# Patient Record
Sex: Female | Born: 1990 | Race: White | Hispanic: Yes | Marital: Single | Smoking: Never smoker
Health system: Southern US, Community
[De-identification: ages and names within clinical notes are randomized; demographics above are authoritative.]

---

## 2016-11-04 ENCOUNTER — Ambulatory Visit (INDEPENDENT_AMBULATORY_CARE_PROVIDER_SITE_OTHER): Payer: Self-pay | Admitting: Pharmacist

## 2016-11-04 ENCOUNTER — Encounter: Payer: Self-pay | Admitting: Pharmacist

## 2016-11-04 ENCOUNTER — Ambulatory Visit (INDEPENDENT_AMBULATORY_CARE_PROVIDER_SITE_OTHER): Payer: Self-pay | Admitting: Family Medicine

## 2016-11-04 ENCOUNTER — Encounter: Payer: Self-pay | Admitting: Family Medicine

## 2016-11-04 ENCOUNTER — Ambulatory Visit
Admission: RE | Admit: 2016-11-04 | Discharge: 2016-11-04 | Disposition: A | Discharge status: Home / Self Care | Payer: No Typology Code available for payment source | Source: Ambulatory Visit | Attending: Family Medicine | Admitting: Family Medicine

## 2016-11-04 VITALS — BP 104/68 | HR 81 | Temp 97.7°F | Ht 64.0 in | Wt 159.8 lb

## 2016-11-04 DIAGNOSIS — Y9315 Activity, underwater diving and snorkeling: Secondary | ICD-10-CM

## 2016-11-04 DIAGNOSIS — Z008 Encounter for other general examination: Secondary | ICD-10-CM

## 2016-11-04 DIAGNOSIS — Z0000 Encounter for general adult medical examination without abnormal findings: Secondary | ICD-10-CM

## 2016-11-04 LAB — POCT UA - GLUCOSE/PROTEIN
GLUCOSE UA: NEGATIVE
PROTEIN UA: NEGATIVE

## 2016-11-04 LAB — GLUCOSE, POCT (MANUAL RESULT ENTRY): POC GLUCOSE: 85 mg/dL (ref 70–99)

## 2016-11-04 LAB — POCT HEMOGLOBIN: HEMOGLOBIN: 13.7 g/dL (ref 12.2–16.2)

## 2016-11-04 NOTE — Patient Instructions (Addendum)
Thank you for coming in today!  Your pulmonary function test results revealed adequate lung function for diving.  Please consider a trial of an antihistamine (Claritin, Zyrtec or Allegra) and nasal saline to help with allergy symptoms. These are available over the counter.

## 2016-11-04 NOTE — Assessment & Plan Note (Signed)
Spirometry evaluation without bronchodilator reveals near normal lung function.  FEV1, FVC were slightly below threshold for spirometric parameters, however not enough to be concerning for diving. FEV1/FVC ratio exceeds threshold.

## 2016-11-04 NOTE — Progress Notes (Signed)
Patient ID: Carmen Roberts, female   DOB: 15-Jul-1990, 26 y.o.   MRN: [id number] Reviewed: Agree with Dr. Macky LowerKoval's documentation and management.

## 2016-11-04 NOTE — Progress Notes (Signed)
Subjective:    Carmen Roberts is a 26 y.o. female who [facility]:  [facility].  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  Denies any complications or injuries while diving.  Has never failed a fitness to dive physical.  Currently well, without complaints.  The following portions of the patient's history were reviewed and updated as appropriate: allergies, current medications, past medical history, family and social history, and problem list.  PMH reviewed.  No past medical history on file. No past surgical history on file.  Medications reviewed. No current outpatient prescriptions on file.   No current facility-administered medications for this visit.      PMH:   - denies - No other hospitalizations or other prior medical history   PSH: - denies  Family History: - no lung/pulmonary issues that patient knows of.  Social: - Never smoker - Denies illicit drug use - Very occasional social drinker (1-2 drinks)  SCUBA ROS:  He denies any history of middle ear trauma/disease, vertigo, ocular surgery, asthma or other respiratory issues, seizures, loss of consciousness, recurring neurologic disorders, history of head injury, coagulopathies, evidence of CAD or other structural heart disease, pneumothorax, diabetes, or exercise intolerance.    General ROS:  The patient denies fever, unusual weight change, decreased hearing, chest pain, palpitations, pre-syncopal or syncopal episodes, dyspnea on exertion, prolonged cough, hemoptysis, change in bowel habits, melena, hematochezia, severe indigestion/heartburn, nausea/vomiting/abdominal pain, genital sores, muscle weakness, difficulty walking, abnormal bleeding, or enlarged lymph nodes.     Objective:   Physical Exam BP 104/68   Pulse 81   Temp 97.7 F (36.5 C) (Oral)   Ht 5\' 4"  (1.626 m)   Wt 159 lb 12.8 oz (72.5 kg)    LMP 10/12/2016 (Exact Date)   SpO2 98%   BMI 27.43 kg/m  Gen:  Alert, cooperative patient who appears stated age in no acute distress.  Vital signs reviewed. Head:  Energy/AT Eyes:  Fundoscopy WNL BL.  PERRL, EOMI Ears:  External ears WNL, Bilateral TM's normal without retraction, redness or bulging.  Canals clear BL  Mouth:  Good dental hygiene. Tonsils non-erythematous, non-edematous.   MMM Neck:  Trachea midline Cardiac:  Regular rate and rhythm without murmur auscultated.   Pulm:  Clear to auscultation bilaterally with good air movement throuhout.  No wheezes or rales noted.   Abd:  Soft/nondistended/nontender.  Good bowel sounds throughout all four quadrants.  No masses noted.  Exts: No edema BL LE's, warm and well-perfused Neuro:  Alert and oriented to person, place, and date.  CN II-XII intact.  Sensation intact to light touch and vibration bilateral upper and lower extremities equally.  Motor function equal and strength 5/5 bilateral upper and lower extremities.  Normal gait.  DTRs +2 BL brachialis, patellar.  Finger to nose cerebellar testing within normal limits.  Color vision testing normal. Psych:  Not depressed or anxious appearing.  Linear and coherent thought process as evidenced by speech pattern. Smiles spontaneously.   No results found for this or any previous visit (from the past 72 hour(s)).

## 2016-11-04 NOTE — Progress Notes (Signed)
S:    Patient arrives in good spirits, ambulating without assitance.    Presents for lung function evaluation for "dive physical". Patient reports breathing has been good/normal.   Denies history or breathing difficulty. She reports symptoms consistent with allergic rhinitis including postnasal drip.  Patient performed 2 rounds of PFT testing today as first round technique was suboptimal. During second trial patient was able to improve exhalation duration.  O: Patient provided good effort while attempting spirometry.  FVC 2.85    Calculated Lower Limit for [employer] Diving Standards   FVC = 3.09 FEV1 2.61       Calculated Lower Limit for [employer] Diving Standards   FEV1= 2.67 FEV1/FVC  91.7%   Calculated Lower Limit for [employer] Diving Standards   FEV1/FVC = 76.97%   See "scanned report" or Documentation Flowsheet (discrete results - PFTs) for  Spirometry results and copy of evaluation.   A/P: Spirometry evaluation without bronchodilator reveals near normal lung function.  FEV1, FVC were slightly below threshold for goal spirometric parameters, however not enough to be concerning for diving. FEV1/FVC ratio exceeds threshold.  Offered options for treatment of nasal symptoms including nasal saline and OTC antihistamines.  Following instruction patient verbalized she would consider trial of agents.   Reviewed results of pulmonary function tests.  Pt verbalized understanding of results and education.  Patient seen with Lyanne Cooopali Sharma, PharmD Candidate, and Al CorpusLindsey Foltanski, PharmD PGY-1 Resident.

## 2016-11-08 NOTE — Assessment & Plan Note (Signed)
Vision (distance, near, color), hearing, UA, CBG, Hgb, and Spirometry all within normal limits -- see below for spirometry as slightly below normal but not concerning. Coronary assessment:  N/a due to age Approval for SCUBA diving, I find no medical conditions considered incompatible with diving.

## 2016-11-22 ENCOUNTER — Encounter: Payer: Self-pay | Admitting: Family Medicine

## 2018-08-15 IMAGING — DX DG CHEST 2V
2 series · 2 of 2 positions shown · non-contrast
Comparison: None.

CLINICAL DATA: Chest congestion. Requires clearance for scuba
diving.

EXAM:
CHEST  2 VIEW

[dg chest 2 view (1 of 2)]
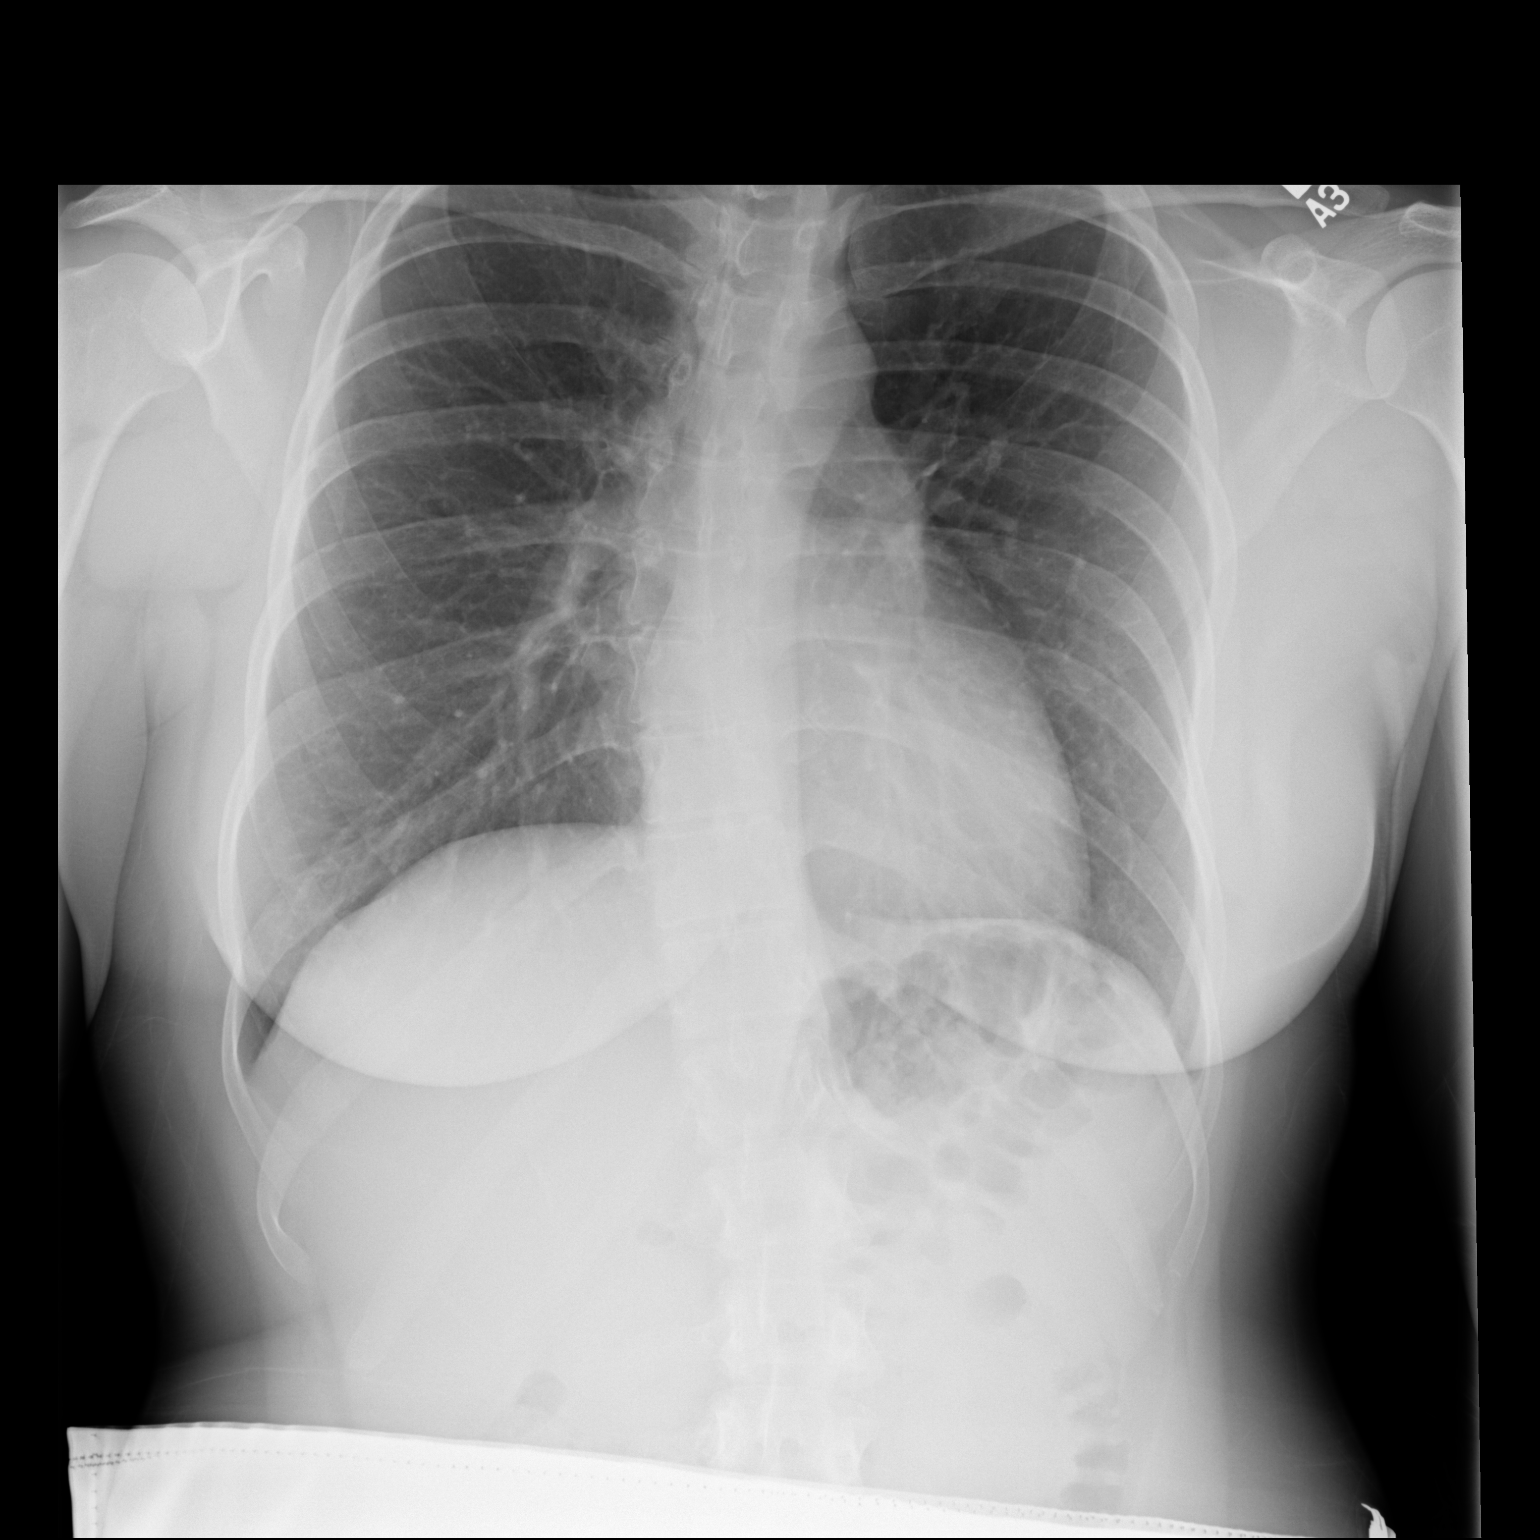

[dg chest 2 view (2 of 2)]
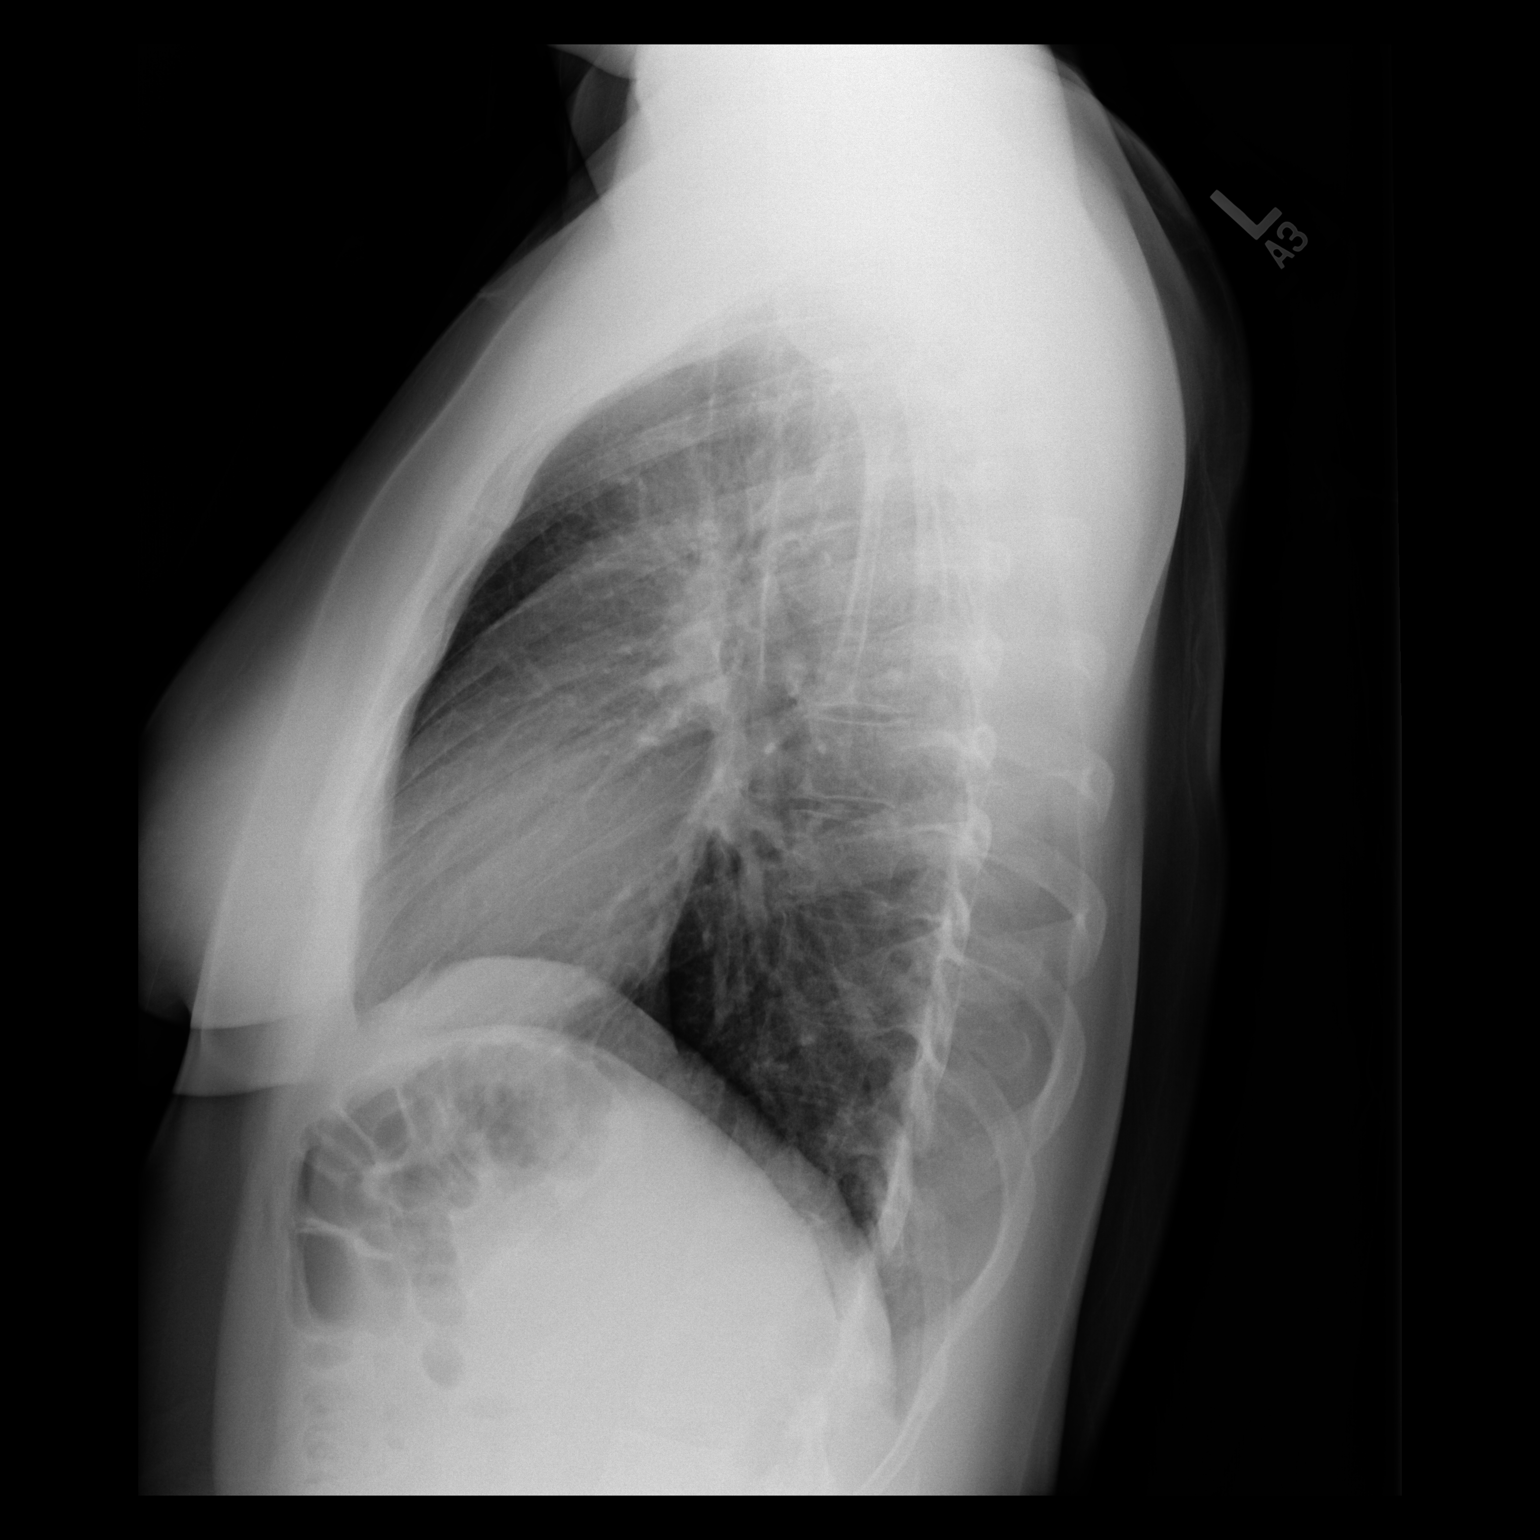

[2 of 2 positions shown; findings below may reference images not displayed]

FINDINGS: The heart size and mediastinal contours are within normal limits.
Both lungs are clear. Mild scoliosis.
IMPRESSION: No active cardiopulmonary disease.
